# Patient Record
Sex: Female | Born: 1985 | Race: White | Hispanic: No | Marital: Single | Smoking: Never smoker
Health system: Southern US, Community
[De-identification: ages and names within clinical notes are randomized; demographics above are authoritative.]

---
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
## 2004-06-16 ENCOUNTER — Observation Stay (HOSPITAL_COMMUNITY): Admission: AD | Admit: 2004-06-16 | Discharge: 2004-06-17 | Payer: Self-pay | Admitting: Obstetrics and Gynecology

## 2004-07-04 ENCOUNTER — Inpatient Hospital Stay (HOSPITAL_COMMUNITY): Admission: AD | Admit: 2004-07-04 | Discharge: 2004-07-04 | Payer: Self-pay | Admitting: Obstetrics and Gynecology

## 2004-07-17 ENCOUNTER — Inpatient Hospital Stay (HOSPITAL_COMMUNITY): Admission: AD | Admit: 2004-07-17 | Discharge: 2004-07-17 | Payer: Self-pay | Admitting: Obstetrics and Gynecology

## 2004-07-19 ENCOUNTER — Inpatient Hospital Stay (HOSPITAL_COMMUNITY): Admission: AD | Admit: 2004-07-19 | Discharge: 2004-07-19 | Payer: Self-pay | Admitting: Obstetrics and Gynecology

## 2004-07-22 ENCOUNTER — Inpatient Hospital Stay (HOSPITAL_COMMUNITY): Admission: AD | Admit: 2004-07-22 | Discharge: 2004-07-25 | Payer: Self-pay | Admitting: Obstetrics and Gynecology

## 2004-08-01 ENCOUNTER — Inpatient Hospital Stay (HOSPITAL_COMMUNITY): Admission: AD | Admit: 2004-08-01 | Discharge: 2004-08-01 | Payer: Self-pay | Admitting: Obstetrics and Gynecology

## 2004-08-04 ENCOUNTER — Inpatient Hospital Stay (HOSPITAL_COMMUNITY): Admission: AD | Admit: 2004-08-04 | Discharge: 2004-08-07 | Payer: Self-pay | Admitting: Obstetrics and Gynecology

## 2004-08-04 ENCOUNTER — Encounter (INDEPENDENT_AMBULATORY_CARE_PROVIDER_SITE_OTHER): Payer: Self-pay | Admitting: *Deleted

## 2004-10-09 ENCOUNTER — Encounter (INDEPENDENT_AMBULATORY_CARE_PROVIDER_SITE_OTHER): Payer: Self-pay | Admitting: Specialist

## 2004-10-09 ENCOUNTER — Observation Stay (HOSPITAL_COMMUNITY): Admission: EM | Admit: 2004-10-09 | Discharge: 2004-10-09 | Payer: Self-pay | Admitting: Emergency Medicine

## 2005-03-23 ENCOUNTER — Other Ambulatory Visit: Admission: RE | Admit: 2005-03-23 | Discharge: 2005-03-23 | Payer: Self-pay | Admitting: Obstetrics and Gynecology

## 2006-04-02 ENCOUNTER — Ambulatory Visit: Payer: Self-pay | Admitting: *Deleted

## 2006-04-12 ENCOUNTER — Ambulatory Visit: Payer: Self-pay | Admitting: *Deleted

## 2006-04-19 ENCOUNTER — Ambulatory Visit: Payer: Self-pay | Admitting: *Deleted

## 2006-04-30 ENCOUNTER — Ambulatory Visit: Payer: Self-pay | Admitting: *Deleted

## 2006-05-07 ENCOUNTER — Ambulatory Visit: Payer: Self-pay | Admitting: *Deleted

## 2006-05-14 ENCOUNTER — Ambulatory Visit: Payer: Self-pay | Admitting: *Deleted

## 2006-05-21 ENCOUNTER — Ambulatory Visit: Payer: Self-pay | Admitting: *Deleted

## 2006-06-09 ENCOUNTER — Ambulatory Visit: Payer: Self-pay | Admitting: *Deleted

## 2006-06-16 ENCOUNTER — Ambulatory Visit: Payer: Self-pay | Admitting: *Deleted

## 2008-07-16 ENCOUNTER — Emergency Department (HOSPITAL_COMMUNITY): Admission: EM | Admit: 2008-07-16 | Discharge: 2008-07-16 | Payer: Self-pay | Admitting: Family Medicine

## 2008-08-24 ENCOUNTER — Emergency Department (HOSPITAL_COMMUNITY): Admission: EM | Admit: 2008-08-24 | Discharge: 2008-08-24 | Payer: Self-pay | Admitting: Family Medicine

## 2008-11-19 ENCOUNTER — Emergency Department (HOSPITAL_COMMUNITY): Admission: EM | Admit: 2008-11-19 | Discharge: 2008-11-20 | Payer: Self-pay | Admitting: Emergency Medicine

## 2009-10-07 IMAGING — CR DG CHEST 2V
2 series · 2 of 2 positions shown · non-contrast
Comparison: None available.

CLINICAL DATA: Back pain.

CHEST - 2 VIEW

[w chest pa]
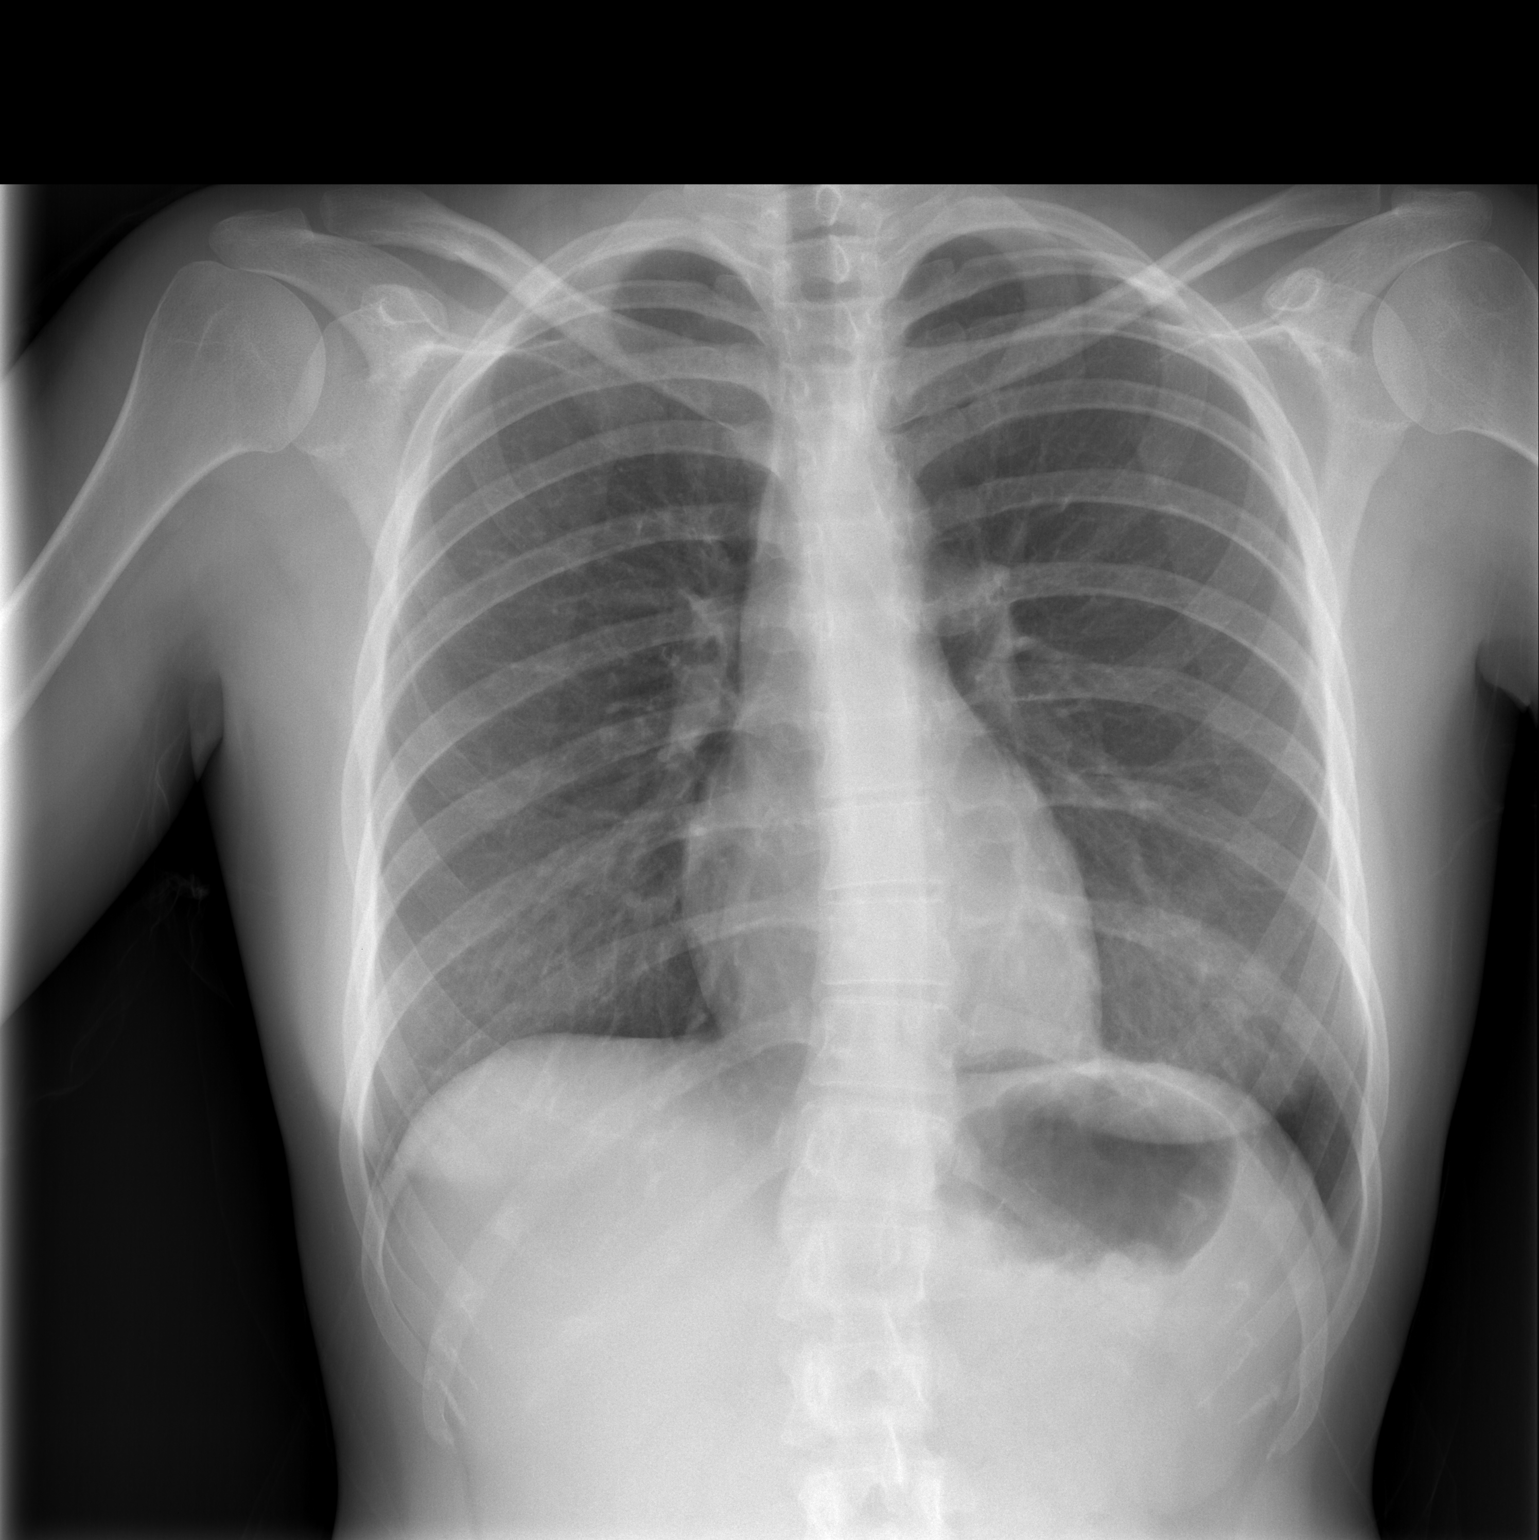

[w chest lat]
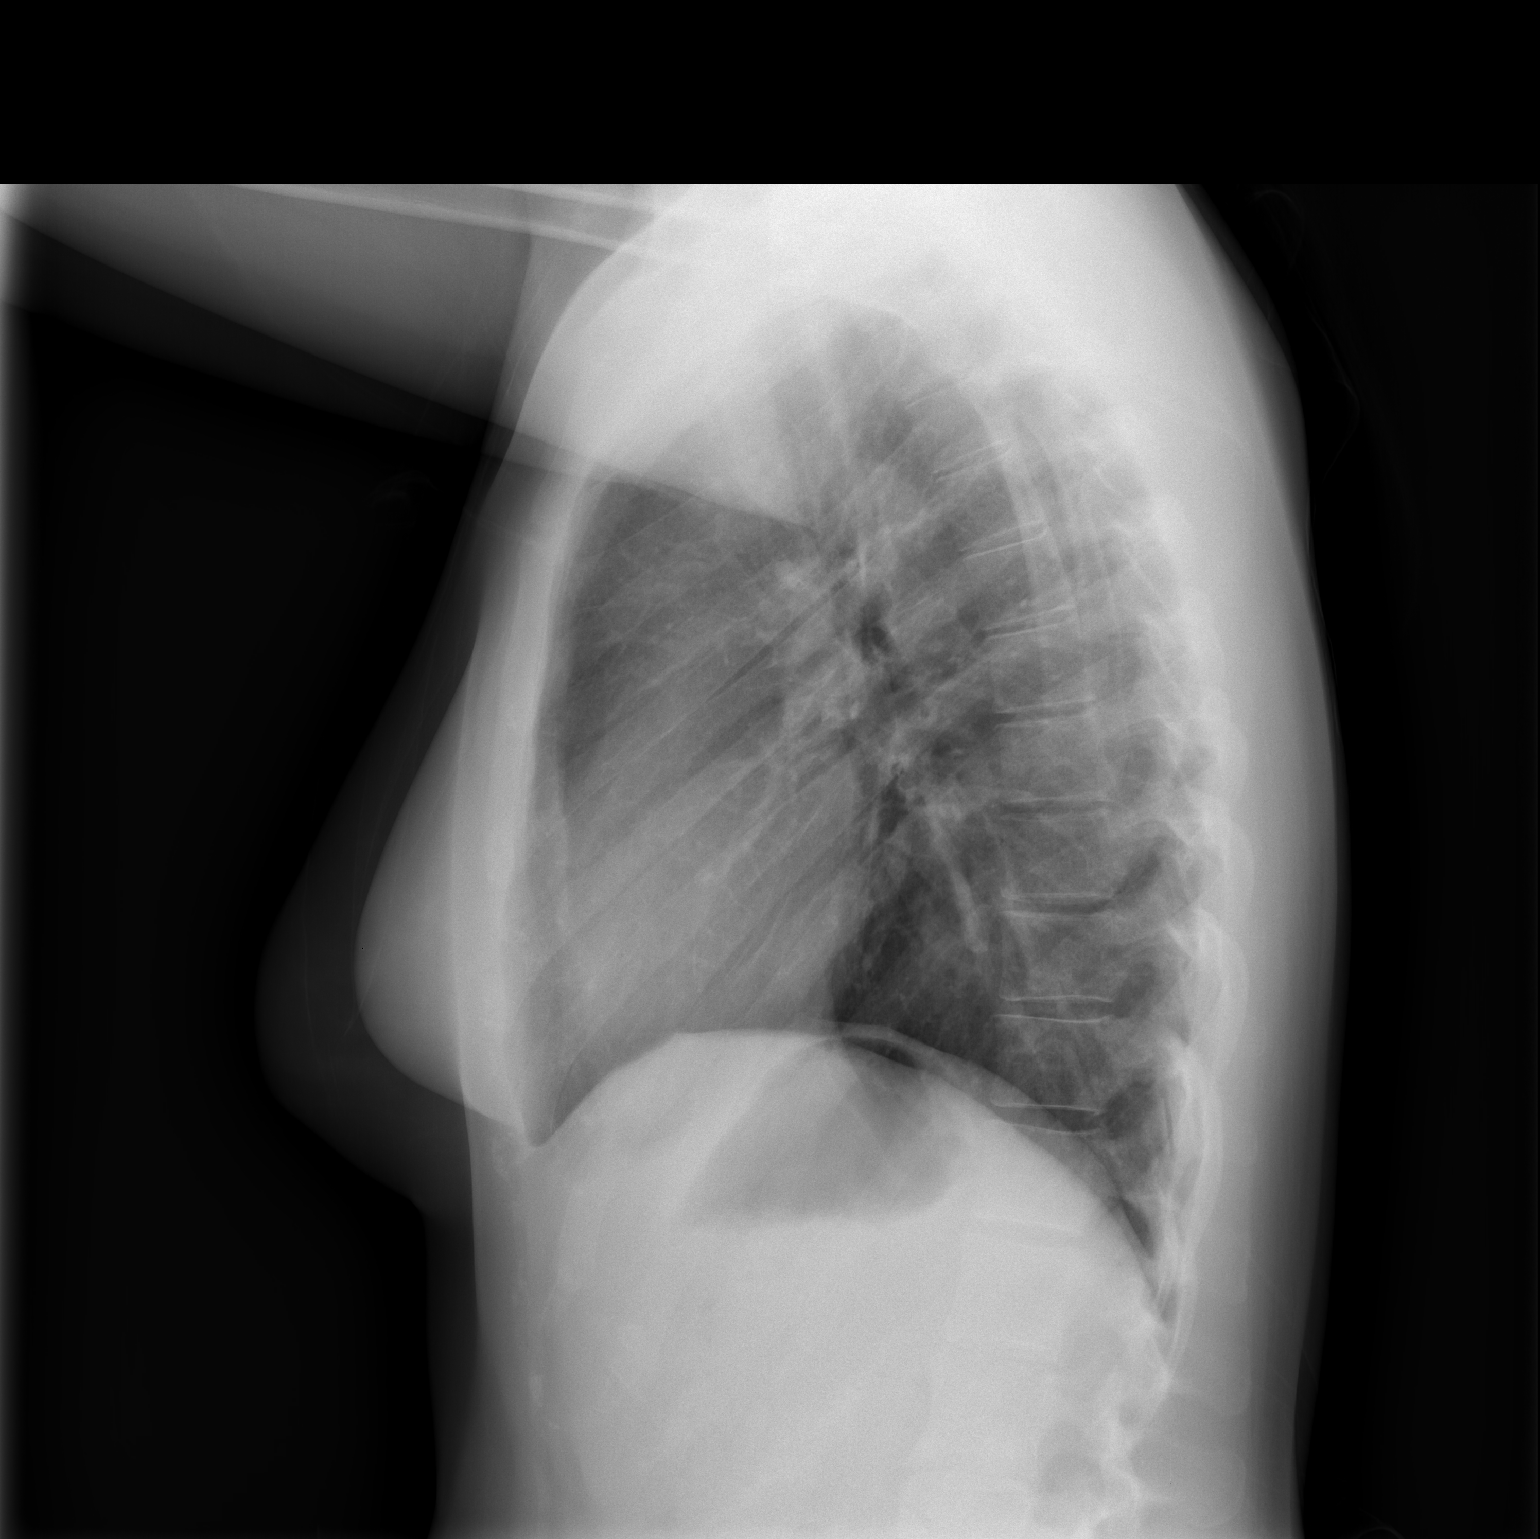

[2 of 2 positions shown; findings below may reference images not displayed]

FINDINGS: The lungs are clear.  Heart size is normal.  No pleural
effusion or focal bony abnormality.
IMPRESSION: No acute disease.

## 2011-04-17 NOTE — Discharge Summary (Signed)
NAME:  Anna Clark, Anna Clark                     ACCOUNT NO.:  [id number]   MEDICAL RECORD NO.:  [id number]                   PATIENT TYPE:  INP   LOCATION:  9165                                 FACILITY:  [facility]   PHYSICIAN:  Hal Morales, M.D.             DATE OF BIRTH:  January 28, 1986   DATE OF ADMISSION:  07/22/2004  DATE OF DISCHARGE:  07/25/2004                                 DISCHARGE SUMMARY   ADMISSION DIAGNOSES:  1. Intrauterine pregnancy at 36 and four-sevenths weeks.  2. Hypertension.  3. Questionable early labor.   DISCHARGE DIAGNOSES:  1. Intrauterine pregnancy at 37 and 0-sevenths weeks.  2. Hypertension, probable pregnancy-induced hypertension without evidence of     preeclampsia.   HOSPITAL PROCEDURES:  1. Electronic fetal monitoring.  2. Ultrasound.  3. A 24-hour urine.   HOSPITAL COURSE:  The patient was admitted with contractions and some  elevations in her blood pressure.  Cervix was 2 cm on admission and did not  change over the time of the admission.  Therefore, labor was ruled out.  She  had been on Procardia for blood pressure control.  She did complain of a  persistent headache but no visual symptoms.  She reported positive fetal  movement.  She was therefore admitted for blood pressure monitoring and  fetal heart rate and uterine contraction monitoring.  PIH labs were sent and  she received IV hydration.  Her hemoglobin came back at 13.3 with platelets  of 256.  PIH labs were all within normal with a uric acid of 5.8.  She  continued her 24-hour urine which came back as less than 4 mg/dl of protein  with a creatinine clearance of 103 which was normal.  Blood pressures  remained 120s to 130s over 74 to 94.  On hospital day #3 she was without  complaint.  She denied headache or visual changes.  She was afebrile.  Blood  pressures were 140s over 80s to 90s.  AFI per ultrasound was 17.5.  PIH labs  were normal.  Heart rate regular rate and rhythm, chest was  clear.  Fetal  heart rate was reactive with irregular contractions.  Extremities showed  trace edema.  She was deemed to have received the full benefit of her  hospital stay and was discharged home.   DISCHARGE MEDICATIONS:  1. Aldomet 500 mg p.o. b.i.d.  2. Prenatal vitamins.   DISCHARGE LABORATORY DATA:  White blood cell count 10.0, hemoglobin 11.3,  platelets 186.  SGOT 19, SGPT 13, uric acid 5.9.  Other chemistries within  normal limits.   DISCHARGE INSTRUCTIONS:  The patient to continue level 2 bedrest and  monitoring of fetal kick counts.  Discharge follow-up on July 29, 2004 for  NST, and further orders per Dr. Pennie Rushing.     Marie L. Williams, C.N.M.                 Hal Morales, M.D.  MLW/MEDQ  D:  07/25/2004  T:  07/26/2004  Job:  [id number]

## 2011-04-17 NOTE — Discharge Summary (Signed)
NAME:  Anna Clark, Anna Clark                     ACCOUNT NO.:  [id number]   MEDICAL RECORD NO.:  [id number]                   PATIENT TYPE:  INP   LOCATION:  9159                                 FACILITY:  [facility]   PHYSICIAN:  Osborn Coho, M.D.                DATE OF BIRTH:  [birth year]   DATE OF ADMISSION:  06/16/2004  DATE OF DISCHARGE:  06/17/2004                                 DISCHARGE SUMMARY   ADMISSION DIAGNOSES:  1. Intrauterine pregnancy at 31-3/7 weeks.  2. Mildly elevated blood pressures and a biophysical profile of 6 out of 8.   DISCHARGE DIAGNOSES:  1. Intrauterine pregnancy at 31-4/7 weeks.  2. Normalized blood pressure on Procardia.  3. Reassuring fetal heart rate tracing.   PROCEDURE:  None.   HOSPITAL COURSE:  The patient is a 25 year old gravida 2, para 0-0-1-0, at  31-3/7 weeks who was seen in the office on June 16, 2004, and noted to have  blood pressures of 120/90 and 130/100 for which she was sent to maternity  admission unit for further evaluation.  Her blood pressure stayed in the  130/70's to mid 90's and she had a biophysical profile that measured 6 out  of 8 with no breathing movements noted.  Her labs were all within normal  limits.  Her urine protein was negative and her physical examination was  also within normal limits.  She was admitted for 23-hour observation with  continuous fetal monitoring which has continued to be reassuring and she was  started on Procardia 10 mg t.i.d. for some preterm uterine contractions  noted during her stay in maternity admission unit.  Her uterine contractions  have subsequently resolved and her blood pressures have normalized and all  of her labs as previously mentioned are within normal limits.  She is in the  middle of a 24-hour urine currently.  She is deemed okay for discharge today  per Dr. Su Hilt.   DISCHARGE INSTRUCTIONS:  Continue on bed rest and to call for any signs or  symptoms of elevated blood pressure  or decreased fetal movement.   DISCHARGE MEDICATIONS:  1. Procardia 10 mg p.o. t.i.d.  2. Prenatal vitamins daily.   LABORATORY DATA:  Her SGOT is 20, SGPT is 19.  Creatinine is 0.5, WBC count  10.9, hemoglobin 12.7, platelets 274.  Urinalysis is negative.  Her uric  acid is 3.1, LDH is 130.   FOLLOW UP:  Tomorrow for blood pressure check and turn-in of her 24-hour  urine.  Friday, July 22, for a nonstress test.  Tuesday, July 26, for  another nonstress test.  Friday, July 29, for a nonstress test and visit  with M.D.   CONDITION ON DISCHARGE:  Well and stable.     Concha Pyo. Duplantis, C.N.M.              Osborn Coho, M.D.    SJD/MEDQ  D:  06/17/2004  T:  06/17/2004  Job:  [id number]

## 2011-04-17 NOTE — H&P (Signed)
NAME:  Anna Clark, Anna Clark NO.:  [id number]   MEDICAL RECORD NO.:  [id number]          PATIENT TYPE:  INP   LOCATION:  0102                         FACILITY:  [facility]   PHYSICIAN:  Ollen Gross. Vernell Morgans, M.D. DATE OF BIRTH:  1986/01/31   DATE OF ADMISSION:  10/08/2004  DATE OF DISCHARGE:                                HISTORY & PHYSICAL   HISTORY OF PRESENT ILLNESS:  Anna Clark is a 25 year old white female who  presents to the emergency room tonight with a right lower quadrant abdominal  pain that started this morning.  The pain has been severe in nature and has  not let up.  She denies any real fevers or nausea or vomiting, no chest  pain, diarrhea, shortness of breath, dysuria.  She is about two months  postpartum from a C-section and otherwise uncomplicated pregnancy.   REVIEW OF SYSTEMS:  Unremarkable.   PAST MEDICAL HISTORY:  Recent pregnancy with pregnancy induced hypertension,  migraine headaches.   PAST SURGICAL HISTORY:  Cesarean section.   CURRENT MEDICATIONS:  None.   ALLERGIES:  No known drug allergies.   SOCIAL HISTORY:  She denies any tobacco products.   FAMILY HISTORY:  Noncontributory.   PHYSICAL EXAMINATION:  In general, she is a well-developed, well-nourished  white female lying in bed a little uncomfortable.  Her skin is warm and dry  with no jaundice.  Extraocular movements are intact. Pupils equal round and  reactive to light. Sclerae are nonicteric.  Lungs are clear bilaterally with  no accessory muscles.  Heart is regular rate and rhythm with normal pulses.  Abdomen is soft but very tender in the right lower quadrant with guarding.  No palpable masses or hepatosplenomegaly.  Extremities have no clubbing,  cyanosis or edema with good strength in the arms and legs.  Psychologically,  she is alert and oriented x3 with no evidence of anxiety and depression.   LABORATORY DATA:  Her urine pregnancy was negative.  White count was  elevated at  14,000.  Her liver functions were normal. On reviewing her CT  scan, she did appear to have an enlarged, inflamed appendix consistent with  acute appendicitis.   ASSESSMENT AND PLAN:  This is a 25 year old white female with acute  appendicitis.  I think she would benefit from having her appendix removed  tonight.  I have explained to her in detail and with her family the risks  and benefits of the operation as well as some of the technical aspects and  they understand and wish to proceed.  We will plan for possible laparoscopic  appendectomy versus open.  We will take her to the operating room tonight.      PST/MEDQ  D:  10/09/2004  T:  10/09/2004  Job:  [id number]

## 2011-04-17 NOTE — H&P (Signed)
NAME:  Anna Clark, ADOLPH                     ACCOUNT NO.:  [id number]   MEDICAL RECORD NO.:  [id number]                   PATIENT TYPE:  INP   LOCATION:  9159                                 FACILITY:  [facility]   PHYSICIAN:  Janine Limbo, M.D.            DATE OF BIRTH:  01-15-1986   DATE OF ADMISSION:  06/16/2004  DATE OF DISCHARGE:                                HISTORY & PHYSICAL   HISTORY OF PRESENT ILLNESS:  Anna Clark is a 25 year old gravida 2, para 0-  0-1-0 at 31-3/7 weeks, who presented from the office for [facility] evaluation.  She had blood pressures of 120/90 and 130/100 in the office.  She denied  headache, visual symptoms and epigastric pain or swelling.  Baseline blood  pressures were 120/80 during her prenatal course.  Pregnancy has been  remarkable for:  1) Questionable LMP; 2) history of migraines; 3) history of  HPV.   While in Maternity Admissions, the patient had reactive fetal heart rate  tracing but did have some variables.  Her blood pressures were in the 131-  138/79-94 range.  She had an ultrasound that showed estimated fetal weight  greater than the 90% to 95% percentile, normal fluid but a BPP of 6/8 with  breathing movements not sustained.  Her PIH labs were within normal limits.  Her urine was negative for protein.  In light of her elevation of blood  pressure and BPP of 6/8, she was admitted to the hospital for 23-hour  observation and continuous fetal heart rate monitoring.   PRENATAL LABORATORIES:  Blood type is O-positive, Rh antibody negative.  VDRL nonreactive.  Rubella titer positive.  Hepatitis B surface antigen  negative.  HIV was declined.  Cystic fibrosis testing was negative.  GC and  Chlamydia cultures were negative.  Pap was normal.  Glucose challenge was  normal.  Hemoglobin upon entry into practice was 13.6; it was normal on  today's reevaluation.  EDC of August 15, 2004 was established by 11-week  ultrasound secondary to questionable  LMP.   HISTORY OF PRESENT PREGNANCY:  The patient entered care at approximately 14  weeks.  She had questionable LMP and had an ultrasound that actually dated  her at approximately 11 weeks.  She had HPV lesions noted on the right  labium near the fourchette.  She had an early ultrasound for dating purposes  which gave an [facility] of August 15, 2004.  She had another ultrasound at 19  weeks that showed normal growth and development and cervix was 3-cm long.  The rest of her pregnancy has been essentially uncomplicated.  Her blood  pressures have been in the 120s/80s.  Today was a regular visit for her.   OBSTETRICAL HISTORY:  In August of 2004, she had a first trimester  termination of pregnancy without complication.   MEDICAL HISTORY:  She was on an oral contraceptive patch and conceived close  to the time of coming off  the patch.  She also had been previously on  Yasmin.  She reports the usual childhood illnesses.  She has had more than 2  UTIs, occasional migraines.  She also reported a questionable history of a  heart murmur as a child but had no workup and no symptoms.   ALLERGIES:  She had no known medication allergies.   FAMILY HISTORY:  Her maternal grandmother had an MI and is now decreased.  Her mother is hypertensive, on medications.  Paternal grandmother had  emphysema and is now deceased.  Mother has adult-onset diabetes and also a  new diagnosis of high blood pressure.   GENETIC HISTORY:  Genetic history is unremarkable.   SOCIAL HISTORY:  The patient is single.  Father of the baby is not currently  involved.  Patient has 2 years of college; she is currently employed in  [employer] and is also a Dispensing optician taking 1 course.  Father of the baby has  an 11th-grade education.  She has been followed by the physicians' service  at [facility].  She denies any alcohol, drug or tobacco use during  this pregnancy.   PHYSICAL EXAM:  VITAL SIGNS:  Blood pressures are  131-138/79-94; other vital  signs are stable.  HEENT:  Within normal limits.  LUNGS:  Bilateral breath sounds are clear.  HEART:  Regular rate and rhythm without murmur.  BREASTS:  Breasts are soft and nontender.  ABDOMEN:  Fundal height is approximately 33 cm.  Estimated fetal weight is 4  pounds by ultrasound.  Fetal heart rate shows reactive fetal heart rate  tracing with occasional mild variables. There is uterine irritability noted  with some occasional contractions of which patient is not aware.  PELVIC:  Cervix is closed and long, vertex -2.  EXTREMITIES:  Deep tendon reflexes are 2+ without clonus.  There is no edema  noted.   IMAGING STUDIES:  Ultrasound shows single intrauterine pregnancy, vertex,  cervix 3.7 cm, estimated fetal weight 2067 through 2124 g, which is greater  than the 90th to 95th percentile.  Fluid is 13.5 cm.  BPP was 6/8 with  breathing movements not sustained.   LABORATORIES:  CBC:  Hemoglobin was 12.7, hematocrit of 36.8, white blood  cell count of 10.9 and platelets of 274,000.  Clean-catch urine showed  negative protein.  Comprehensive metabolic panel was normal.  Uric acid 3.1  and LDH of 130.   IMPRESSION:  1. Intrauterine pregnancy at 31-3/7 weeks.  2. Mild elevation of blood pressure.  3. Biophysical profile of 6/8.   PLAN:  1. Admit to birthing suite per consult with Dr. Janine Limbo for 23-     hour observation, continuous electronic fetal monitoring, initiation of     24-hour urine and Aldomet 250 mg t.i.d.  2. If fetal heart rate remains reactive, anticipate discharge, June 17, 2004.     Renaldo Reel Emilee Hero, C.N.M.                   Janine Limbo, M.D.    VLL/MEDQ  D:  06/16/2004  T:  06/17/2004  Job:  [id number]

## 2011-04-17 NOTE — H&P (Signed)
NAME:  Anna Clark, Anna Clark                     ACCOUNT NO.:  [id number]   MEDICAL RECORD NO.:  [id number]                   PATIENT TYPE:  INP   LOCATION:  9165                                 FACILITY:  [facility]   PHYSICIAN:  Osborn Coho, M.D.                DATE OF BIRTH:  [birth year]   DATE OF ADMISSION:  07/22/2004  DATE OF DISCHARGE:                                HISTORY & PHYSICAL   Anna Clark is a 25 year old gravida 2, para 0-0-1-0 at 36-4/7 weeks who  presented from the office for labor reevaluation and PIH evaluation.  She  was seen in the office today with irregular contractions and cervix 2 cm,  70% vertex, -2.  Her blood pressures in the office was 156/110 and 130/100.  She has been on Procardia XL 60 mg daily for the last several weeks due to  elevated blood pressure.  The patient reports persistent headache with no  visual symptoms.  She has had nausea and vomiting today but no epigastric  pain at present.  She reports positive fetal movement.  Pregnancy has been  remarkable for:   1. Elevated blood pressure since approximately 31 weeks with a 24-hour urine     done on 8/15 with value of 24-hour protein of 102.  She had normal PIH     labs then.  2. Questionable last menstrual period.  3. History of migraines.  4. History of HPV.  5. Positive group B strep.   The patient was evaluated in maternity admission unit and was noted to have  a lot of irritability on the Toco monitor.  Fetal heart rate was nonreactive  for a period of time but then did have a period of reactivity with one  segment of a negative spontaneous CST although subsequent to this her  contractions subsided somewhat.  Her blood pressures were mildly elevated.  The decision was made to admit for her 23-hour observation and to begin a 24-  hour urine.   PRENATAL LABORATORY DATA:  Blood type is O positive, Rh antibody negative,  VDRL nonreactive, rubella titer positive, hepatitis B surface antigen  negative.  HIV was declined.  Cystic fibrosis testing was negative.  GC and  chlamydia cultures were negative.  Pap was normal.  Glucose challenge was  normal.  Repeat RPR was negative.  Group B strep culture was positive at 36  weeks.  She had  PIH last done on 8/15 that were normal.  EDC of 08/15/04 was  established by 11 week ultrasound secondary to questionable LMP.   HISTORY OF PRESENT PREGNANCY:  The patient entered care at approximately 12-  13 weeks.  She had an ultrasound soon after her first visit that  reestablished her due date of 08/15/04.  She had another ultrasound at 19  weeks that was in agreement.  Quadruple screen was normal.  She had follow-  up ultrasound at 22 weeks secondary to inadequate anatomy  views.  This did  fill in the blanks of those things that were not able to be seen the first  time.  Her hemoglobin at 29 weeks was 12.2.  She was evaluated at 31 weeks  for elevated blood pressures of 120/90 and 130/100.  She had PIH labs at  that time which were normal.  She was admitted for 23-hour observation  secondary to mild elevations of her blood pressure and a BPP of 6/8.  She  had a 24-hour urine done at that time.  Estimated fetal weight at that time  was greater than the 96th percentile.  A 24-hour urine showed minimal  protein.  She had 4 mg of protein.  She was placed on Procardia at  approximately 32-33 weeks.  She had a BPP of 8/8 at 33 weeks with normal  fluid.  Cervix was 2.7 cm.  There was a nonreactive NST; however, the BPP  was normal.  Bilateral renal pelvises were noted to be in the upper limits  of normal.  NSTs were begun after that.  She had another 24 hour urine done  at approximately 34 weeks that showed minimal protein.  She was sent to [facility]U  again at 34 weeks because of elevated blood pressure and had PIH labs with  fetal monitoring and blood pressure evaluation.  BPP was 8/8 at that time.  She had significant constipation at 34 weeks.  She had  another 24-hour urine  done at that time which showed 102 mg of protein.  Pressures were in the  140s/88, 140/98 time and she had another ultrasound at 35 weeks that showed  growth in the 75th to 90th percentile and a BPP of 8/8.  Positive beta strep  was noted at that time and her cervix was 1 cm, 50%, -3.  She then presented  to the office today for evaluation of nausea, vomiting, and contractions.   OBSTETRICAL HISTORY:  In 8/04, she had a therapeutic termination of  pregnancy in the first trimester.   MEDICAL HISTORY:  She was on the patch from 8/04 until 11/04.  She was on  Yasmin previously.  She has had one yeast infection in the past.  She  reports the usual childhood illnesses.  She has had one or two UTIs in the  past.  She has occasional migraines.  She had a report of a heart murmur as  a child but has had no workup and has had no symptoms.   FAMILY HISTORY:  Her maternal grandmother had a heart attack.  Her mother is  hypertensive, on medication.  Her paternal grandmother had emphysema and is  now deceased.  Her mother has adult-onset diabetes and it is diet  controlled.   GENETIC HISTORY:  Unremarkable.   SOCIAL HISTORY:  The patient is not married.  The father of the baby is not  involved.  The patient is supported by her family.  The patient has 1 year  of college.  She is currently in school as well and is employed in [employer].  She is a Consulting civil engineer at [employer].  She is Caucasian in ethnicity.  She denies any  alcohol, drug, or tobacco use during this pregnancy.  She also denies any  history of abuse.   PHYSICAL EXAMINATION:  VITAL SIGNS:  Blood pressures were ranging from  139/104, 138/85,  144/80.  Other vital signs were stable.  HEENT:  Within normal limits.  HEART:  Regular rate and rhythm without murmur.  BREASTS:  Soft and nontender.  ABDOMEN:  Fundal height is approximately 36 cm.  Estimated fetal weight 6 to 6-1/2 pounds.  Uterine contractions are demonstrated to be  a very irritable  pattern.  There are occasional more defined uterine contractions.  Electronic fetal monitoring initially was nonreactive with no decelerations.  She then did have a segment of reactivity and had a single segment of a  negative, spontaneous CST.  EXTREMITIES:  Deep tendon reflexes are 1+ without clonus.  There is a trace  edema noted.  PELVIC:  Exam remains unchanged at 2 cm, 60% vertex, to -2 station.   IMPRESSION:  1. Intrauterine pregnancy at 36-4/7 weeks.  2. Mild elevation of her blood pressure.  3. Early versus prodromal labor.  4. Nausea and vomiting today.   PLAN:  1. Admit to the [facility] for 23-hour observation, for     consult with Dr. Osborn Coho as attending physician.  2. PIH labs will be evaluated.  3. A 24-hour urine will be done.  4. Labor observation for advancement of cervical dilatation.  5. Continuous electronic fetal monitoring.     Renaldo Reel Emilee Hero, C.N.M.                   Osborn Coho, M.D.    VLL/MEDQ  D:  07/23/2004  T:  07/23/2004  Job:  [id number]

## 2011-04-17 NOTE — Discharge Summary (Signed)
NAME:  Anna Clark, Anna Clark                     ACCOUNT NO.:  [id number]   MEDICAL RECORD NO.:  [id number]                   PATIENT TYPE:  INP   LOCATION:  9145                                 FACILITY:  [facility]   PHYSICIAN:  Osborn Coho, M.D.                DATE OF BIRTH:  06-05-1986   DATE OF ADMISSION:  08/04/2004  DATE OF DISCHARGE:                                 DISCHARGE SUMMARY   ADMISSION DIAGNOSES:  1.  Intrauterine pregnancy at term.  2.  Pregnancy-induced hypertension.  3.  Umbilical cord prolapse.   DISCHARGE DIAGNOSIS:  1.  Intrauterine pregnancy at term.  2.  Pregnancy-induced hypertension.  3.  Umbilical cord prolapse.  4.  Status post primary low transverse cesarean section of a female infant      named [name], Apgars 8 and 9.   HOSPITAL PROCEDURES:  1.  Induction of labor.  2.  Amniotomy.  3.  Primary low transverse cesarean section.  4.  General anesthesia.   HOSPITAL COURSE:  The patient was admitted for induction of labor secondary  to preeclampsia.  Cervix was 2-3 cm with the vertex at -1 to -2 on  admission.  Amniotomy was performed.  Immediately following that procedure  the umbilical cord was noted to have prolapsed in front of the head and an  emergency cesarean section was done without complications.  EBL was 700 mL.  Female infant, [name], was delivered with Apgars of 8 and 9.  On postoperative  day #1, the patient was tolerating clear liquids, doing well.  Hemoglobin  was 10.6 and routine care was given.  She received 24 hours of antibiotics  and was on Aldomet for her blood pressure.  On postoperative day #2, she was  doing well, beginning to diurese.  Blood pressures were 130s to 150s over  70s to 90s and she was continued with routine postoperative care.  On  postoperative day #3, vital signs were stable, blood pressure 129 to 150  over 79 to 95, heart rate regular rate and rhythm, lungs were clear.  Incision was clean, dry, and intact with  Steri-Strips.  Lochia was small,  extremities within normal limits.  She was deemed to have received the full  benefit of her hospital stay and was discharged home.  Her weight went from  150 to 145.   DISCHARGE LABORATORY DATA:  White blood cell count 11.5, hemoglobin 10.6,  platelets 195.  Chemistries were within normal limits.   DISCHARGE MEDICATIONS:  1.  Motrin 600 mg p.o. q.6h. p.r.n.  2.  Tylox one to two p.o. q.4h. p.r.n.   Decision was made to not have the patient on blood pressure medicines upon  discharge per Dr. Normand Sloop.   DISCHARGE FOLLOW-UP:  The patient is to be seen by the [facility] nurse in 1-  2 days for a blood pressure check.  If the blood pressure is elevated, she  will be seen  in the office in 1 week.  If the blood pressure is normal, she  will be seen in the office in 6 weeks.     Marie L. Williams, C.N.M.                 Osborn Coho, M.D.    MLW/MEDQ  D:  08/07/2004  T:  08/07/2004  Job:  [id number]

## 2011-04-17 NOTE — H&P (Signed)
NAME:  Anna Clark, Anna Clark                     ACCOUNT NO.:  [id number]   MEDICAL RECORD NO.:  [id number]                   PATIENT TYPE:  INP   LOCATION:  9145                                 FACILITY:  [facility]   PHYSICIAN:  Osborn Coho, M.D.                DATE OF BIRTH:  05/12/1986   DATE OF ADMISSION:  08/04/2004  DATE OF DISCHARGE:                                HISTORY & PHYSICAL   HISTORY OF PRESENT ILLNESS:  This is a 25 year old gravida 2, para 0-0-1-0,  at 8 3/7 weeks who presents for induction of labor secondary to pregnancy  induced hypertension which is becoming more difficult to manage.  She has  been followed by Dr. Su Hilt and her pregnancy has been remarkable for:  1.  Unsure LMP.  2.  History of migraines.  3.  HPV.  4.  Hypertension.  5.  Group B Strep positive.   OB HISTORY:  OB history is remarkable for a first trimester elective  abortion in 2004 with no complications.   PAST MEDICAL HISTORY:  Remarkable for yeast infections, childhood Varicella,  occasional migraines, history of a heart murmur as a child for which she has  not had any workup or symptoms.   FAMILY HISTORY:  Remarkable for a grandmother with MI, mother with  hypertension, grandmother with emphysema, mother with diabetes.  Genetic  history is unremarkable.   SOCIAL HISTORY:  Mother is single, father of the baby is not currently  involved.  Her family is supportive.  She does not report a religious  affiliation.  She denies any alcohol or drug abuse, but did smoke cigarettes  up until the beginning of pregnancy.   PRENATAL LABS:  Hemoglobin 13.6, platelets 292, blood type O positive,  antibody screen negative, RPR nonreactive, Rubella immune, hepatitis  negative, HIV declined, Pap test normal, Chlamydia negative, cystic fibrosis  negative, Group B Strep positive.   HISTORY OF CURRENT PREGNANCY:  Patient had care at [redacted] weeks gestation, she  had an ultrasound to confirm her dates in the  first trimester, her second  trimester ultrasound was within normal limits.  She developed some  hypertension at 31 weeks which continued.  Ultrasound at that time showed  greater than 95th percentile growth.  She was placed on Procardia for blood  pressure control, 24 hour urine.  She had 4 mg protein.  She had another 24  hour urine at 34 weeks which showed 102 mg protein.  Blood pressure was  still controlled on Procardia but she was later changed to [place].   OBJECTIVE:  VITAL SIGNS:  Stable, blood pressure 130/90.  HEENT:  Within normal limits.  Thyroid normal, not enlarged.  CHEST:  Clear to auscultation.  HEART:  Regular rate and rhythm.  ABDOMEN:  Gravid, 38 cm, EFM shows reactive fetal heart rate with occasional  contractions.  Cervix was 2/70/-2, and vertex.  EXTREMITIES:  Within normal limits.   ASSESSMENT:  1.  Intrauterine pregnancy at 38 weeks.  2.  Pregnancy induced hypertension.  3.  Elective induction of labor.   PLAN:  1.  Admit to birthing suites per Dr. Su Hilt.  2.  Elective induction of labor and further to follow.     Marie L. Williams, C.N.M.                 Osborn Coho, M.D.    MLW/MEDQ  D:  08/04/2004  T:  08/04/2004  Job:  [id number]

## 2011-04-17 NOTE — Op Note (Signed)
NAME:  Anna Clark, Anna Clark NO.:  [id number]   MEDICAL RECORD NO.:  [id number]          PATIENT TYPE:  INP   LOCATION:  0445                         FACILITY:  [facility]   PHYSICIAN:  Ollen Gross. Vernell Morgans, M.D. DATE OF BIRTH:  1986-06-23   DATE OF PROCEDURE:  10/08/2004  DATE OF DISCHARGE:  10/09/2004                                 OPERATIVE REPORT   PREOPERATIVE DIAGNOSIS:  Appendicitis.   POSTOPERATIVE DIAGNOSIS:  Appendicitis.   PROCEDURE:  Laparoscopic appendectomy.   SURGEON:  Dr. Carolynne Edouard   ANESTHESIA:  General endotracheal.   DESCRIPTION OF PROCEDURE:  After informed consent was obtained, the patient  was brought to the operating room and placed in a supine position on the  operating room table.  After adequate induction of general anesthesia, the  patient's abdomen was prepped with Betadine and draped in the usual sterile  manner.  The area below the umbilicus was infiltrated with 0.25% Marcaine,  and a small incision was made with the 15 blade knife.  This incision was  carried down through the skin, through the subcutaneous tissue bluntly with  a Kelly clamp and [employer] retractors until the linea alba was identified.  The linea alba was incised with the 15 blade knife, and each side was  grasped with Kocher clamps and elevated anteriorly.  The preperitoneal space  was then probed bluntly with a hemostat until the peritoneum was opened and  access was gained to the abdominal cavity.  A 0 Vicryl pursestring stitch  was placed in the fascia surrounding the opening.  An Hasson cannula was  placed through the opening and anchored in place with the previously-placed  Vicryl pursestring stitch.  The abdomen was then insufflated with carbon  dioxide without difficulty.  The laparoscope was placed through the Hasson  cannula, and the right lower quadrant was inspected.  The patient was placed  in Trendelenburg position and rotated slightly with the right side up.  The  cecum was able to be identified, and the tip of an inflamed appendix was  also identified.  Next, the suprapubic region was infiltrated with 0.25%  Marcaine, and a small incision was made with the 15 blade knife, and a 12 mm  port was placed bluntly through this incision into the abdominal cavity  under direct vision.  The laparoscope was then moved to the suprapubic port.  A site for an upper abdominal 5 mm port was then chosen, and this area was  infiltrated with 0.25% Marcaine.  A small stab incision was made with the 15  blade knife, and a 5 mm port was placed bluntly through this incision into  the abdominal cavity under direct vision.  Using a Glassman grasper and  harmonic scalpel, the appendix was able to be identified and elevated.  The  mesoappendix was taken down sharply with the harmonic scalpel to the base of  the appendix at its junction with the cecum.  Once this junction was easily  identified and cleared of any debris, the appendix was held anteriorly with  the [facility] grasper, and a laparoscopic GIA stapler was placed through the  Hasson cannula across the base of the appendix, clamped, and fired, thereby  dividing it between staple lines.  Next, a laparoscopic bag was placed  through the Hasson cannula, and the appendix was placed within the bag, and  the bag was sealed.  The appendiceal stump was then inspected.  It appeared  to be hemostatic.  The area was irrigated was copious amounts of saline.  The bag with the appendix was then removed with the Hasson cannula through  the infraumbilical port without difficulty.  The fascial defect was closed  with the previously placed Vicryl pursestring stitch as well as with another  interrupted 0 Vicryl stitch.  The rest of the ports were removed under  direct vision and were found to be hemostatic.  Gas was allowed to escape.  The fascial defect of the suprapubic port was closed with an interrupted 0  Vicryl stitch, and the skin  incisions were  all closed with interrupted 4-0 Monocryl subcuticular stitches.  Benzoin and  Steri-Strips were applied.  The patient tolerated the procedure well.  At  the end of the case, all needle, sponge, and instrument counts were correct.  The patient was then awakened and taken to the recovery room in stable  condition.      PST/MEDQ  D:  10/14/2004  T:  10/14/2004  Job:  [id number]

## 2011-04-17 NOTE — Op Note (Signed)
NAME:  Anna Clark, Anna Clark                     ACCOUNT NO.:  [id number]   MEDICAL RECORD NO.:  [id number]                   PATIENT TYPE:  INP   LOCATION:  9167                                 FACILITY:  [facility]   PHYSICIAN:  Osborn Coho, M.D.                DATE OF BIRTH:  12-09-1985   DATE OF PROCEDURE:  08/04/2004  DATE OF DISCHARGE:                                 OPERATIVE REPORT   PREOPERATIVE DIAGNOSES:  1.  Term intrauterine pregnancy.  2.  Pregnancy induced hypertension.  3.  Prolapsed cord.   POSTOPERATIVE DIAGNOSE:  1.  Term intrauterine pregnancy.  2.  Pregnancy induced hypertension.  3.  Prolapsed cord.   PROCEDURE:  Primary low transverse cesarean section via Pfannenstiel skin  incision.   SURGEON:  Osborn Coho, M.D.   ASSISTANT:  Elby Showers. Williams, C.N.M.   ANESTHESIA:  General.   FLUIDS:  800 mL.   URINE OUTPUT:  250 mL.   ESTIMATED BLOOD LOSS:  700 mL.   COMPLICATIONS:  None.   FINDINGS:  Alive female infant, [name].  Apgars 8 at one minute and 9 at five  minutes.  Normal appearing bilateral ovaries and tubes for gases.  Arterial  7.19 and venous of 7.28.   DESCRIPTION OF PROCEDURE:  The patient was taken to the operating room under  emergent conditions after cord prolapse noted.  The patient was placed under  general anesthesia and Betadine poured over abdomen.  Dressing was placed  and Pfannenstiel skin incision was made and carried down to the underlying  layer of fascia with the knife bilaterally.  The fascia was extended  manually and the muscles separated in the midline.  The peritoneum was  entered bluntly.  The bladder blade was placed and scalpel used to create  the bladder flap and uterine incision which was then extended manually.  The  infant was delivered.  The cord was clamped and cut.  The infant was handed  to the waiting pediatricians.  A dose of gentamicin was administered as  well.  The patient had already been receiving  penicillin for group B strep  positive status.  The placenta was removed via fundal massage.  Cord gas and  cord blood were collected.  The uterus was cleared of all clots and debris.  The uterus was exteriorized and uterine incision repaired with 0 Vicryl in a  running locked fashion.  A second imbricating layer was performed.  The  uterus was returned to the intra-abdominal cavity.  Copious irrigation was  performed and Foley was then placed with approximately 250 mL of urine  returning.  The peritoneum was closed with 3-0 chromic in a running fashion.  The fascia was repaired with 0 Vicryl in a running fashion.  The  subcutaneous tissue was irrigated and made hemostatic with the Bovie.  The  skin was closed with 3-0 Monocryl using a subcuticular stitch.  The patient  tolerated the  procedure well and currently is being extubated by anesthesia.  The patient will then be returned to the recovery room.  Radiology was  called for x-rays secondary to being able to do count prior to starting  procedure.  We are currently awaiting radiology report.                                               Osborn Coho, M.D.    AR/MEDQ  D:  08/04/2004  T:  08/04/2004  Job:  [id number]

## 2011-08-24 ENCOUNTER — Other Ambulatory Visit: Payer: Self-pay | Admitting: Obstetrics & Gynecology

## 2011-09-04 LAB — URINALYSIS, ROUTINE W REFLEX MICROSCOPIC
Hgb urine dipstick: NEGATIVE
Leukocytes, UA: NEGATIVE
Specific Gravity, Urine: 1.03 (ref 1.005–1.030)
Urobilinogen, UA: 1 mg/dL (ref 0.0–1.0)
pH: 7.5 (ref 5.0–8.0)

## 2011-09-04 LAB — PREGNANCY, URINE: Preg Test, Ur: NEGATIVE

## 2013-07-28 ENCOUNTER — Other Ambulatory Visit: Payer: Self-pay

## 2014-08-16 ENCOUNTER — Encounter (HOSPITAL_COMMUNITY): Payer: Self-pay | Admitting: Emergency Medicine

## 2014-08-16 ENCOUNTER — Emergency Department (HOSPITAL_COMMUNITY)
Admission: EM | Admit: 2014-08-16 | Discharge: 2014-08-16 | Disposition: A | Discharge status: Home / Self Care | Payer: 59 | Attending: Emergency Medicine | Admitting: Emergency Medicine

## 2014-08-16 ENCOUNTER — Emergency Department (HOSPITAL_COMMUNITY): Payer: 59

## 2014-08-16 DIAGNOSIS — R079 Chest pain, unspecified: Secondary | ICD-10-CM | POA: Diagnosis present

## 2014-08-16 DIAGNOSIS — R0789 Other chest pain: Secondary | ICD-10-CM | POA: Diagnosis not present

## 2014-08-16 DIAGNOSIS — R0602 Shortness of breath: Secondary | ICD-10-CM | POA: Diagnosis not present

## 2014-08-16 LAB — BASIC METABOLIC PANEL
ANION GAP: 13 (ref 5–15)
BUN: 10 mg/dL (ref 6–23)
CALCIUM: 9.1 mg/dL (ref 8.4–10.5)
CO2: 25 mEq/L (ref 19–32)
Chloride: 104 mEq/L (ref 96–112)
Creatinine, Ser: 0.64 mg/dL (ref 0.50–1.10)
GLUCOSE: 100 mg/dL — AB (ref 70–99)
POTASSIUM: 3.7 meq/L (ref 3.7–5.3)
SODIUM: 142 meq/L (ref 137–147)

## 2014-08-16 LAB — CBC
HCT: 40.7 % (ref 36.0–46.0)
HEMOGLOBIN: 13.7 g/dL (ref 12.0–15.0)
MCH: 32 pg (ref 26.0–34.0)
MCHC: 33.7 g/dL (ref 30.0–36.0)
MCV: 95.1 fL (ref 78.0–100.0)
Platelets: 262 10*3/uL (ref 150–400)
RBC: 4.28 MIL/uL (ref 3.87–5.11)
RDW: 12.8 % (ref 11.5–15.5)
WBC: 6.6 10*3/uL (ref 4.0–10.5)

## 2014-08-16 LAB — I-STAT TROPONIN, ED: TROPONIN I, POC: 0 ng/mL (ref 0.00–0.08)

## 2014-08-16 LAB — PRO B NATRIURETIC PEPTIDE: PRO B NATRI PEPTIDE: 16.3 pg/mL (ref 0–125)

## 2014-08-16 MED ORDER — NAPROXEN 500 MG PO TABS: 500.0000 mg | ORAL_TABLET | Freq: Two times a day (BID) | ORAL | Status: AC

## 2014-08-16 NOTE — ED Provider Notes (Signed)
CSN: [id number]     Arrival date & time 08/16/14  1229 History   First MD Initiated Contact with Patient 08/16/14 1529     Chief Complaint  Patient presents with  . Chest Pain     (Consider location/radiation/quality/duration/timing/severity/associated sxs/prior Treatment) HPI Anna Clark is a 28 y.o. female who presents to emergency department complaining of chest pain. Patient has no medical problems. She states pain started after she woke up this morning. She reports pain is central, states "it is between the breasts" and states radiate to the back. Pain is pressure-like, but states once in a while gets shooting pain that is sharp and is 10 out of 10. She reports associated shortness of breath. She does not know what brings the pain on. She states she does not have increased pain with movement or with exertion. She states her chest is tender to palpation. Denies any unusual activity or injury. Denies any recent travel or surgeries. No pain or swelling in legs. No cough or URI symptoms. No other complaints. Sates was at work and went to a medical office where they gave her maalox and sent her here. Pt denies hx of acid reflux  History reviewed. No pertinent past medical history. History reviewed. No pertinent past surgical history. No family history on file. History  Substance Use Topics  . Smoking status: Never Smoker   . Smokeless tobacco: Not on file  . Alcohol Use: No   OB History   Grav Para Term Preterm Abortions TAB SAB Ect Mult Living                 Review of Systems  Constitutional: Negative for fever and chills.  Respiratory: Positive for chest tightness and shortness of breath. Negative for cough, wheezing and stridor.   Cardiovascular: Positive for chest pain. Negative for palpitations and leg swelling.  Gastrointestinal: Negative for nausea, vomiting, abdominal pain and diarrhea.  Genitourinary: Negative for dysuria and flank pain.  Musculoskeletal: Negative  for arthralgias, myalgias, neck pain and neck stiffness.  Skin: Negative for rash.  Neurological: Negative for dizziness, weakness and headaches.  All other systems reviewed and are negative.     Allergies  Review of patient's allergies indicates no known allergies.  Home Medications   Prior to Admission medications   Not on File   BP 118/71  Pulse 54  Temp(Src) 97.3 F (36.3 C) (Oral)  Resp 18  SpO2 99%  LMP 08/16/2014 Physical Exam  Nursing note and vitals reviewed. Constitutional: She is oriented to person, place, and time. She appears well-developed and well-nourished. No distress.  HENT:  Head: Normocephalic.  Eyes: Conjunctivae are normal.  Neck: Neck supple.  Cardiovascular: Normal rate, regular rhythm and normal heart sounds.   Pulmonary/Chest: Effort normal and breath sounds normal. No respiratory distress. She has no wheezes. She has no rales. She exhibits tenderness.  Tenderness over lower sternum  Abdominal: Soft. Bowel sounds are normal. She exhibits no distension. There is no tenderness. There is no rebound.  Musculoskeletal: She exhibits no edema.  Neurological: She is alert and oriented to person, place, and time.  Skin: Skin is warm and dry.  Psychiatric: She has a normal mood and affect. Her behavior is normal.    ED Course  Procedures (including critical care time) Labs Review Labs Reviewed  BASIC METABOLIC PANEL - Abnormal; Notable for the following:    Glucose, Bld 100 (*)    All other components within normal limits  CBC  PRO B  NATRIURETIC PEPTIDE  I-STAT TROPOININ, ED    Imaging Review Dg Chest 2 View  08/16/2014   CLINICAL DATA:  Chest pain and shortness of Breath  EXAM: CHEST  2 VIEW  COMPARISON:  11/19/2008  FINDINGS: The heart size and mediastinal contours are within normal limits. Both lungs are clear. The visualized skeletal structures are unremarkable.  IMPRESSION: No active cardiopulmonary disease.   Electronically Signed   By: Alcide Clever M.D.   On: 08/16/2014 14:00     EKG Interpretation None      MDM   Final diagnoses:  Chest pain, unspecified chest pain type    Pt with no risk factors for CAD. Chest pain atypical. TIMI 0, heart score 1. Pt is PERC negative. suspect most likely costochondritis given severe tenderness over the sternum. Will treat with NSAIDs. Close follow up with primary care doctor.   Filed Vitals:   08/16/14 1239 08/16/14 1537  BP: 115/66 118/71  Pulse: 76 54  Temp: 97.3 F (36.3 C)   TempSrc: Oral   Resp: 18 18  SpO2: 100% 99%      Khaidyn Staebell A Laqueena Hinchey, PA-C 08/17/14 0021

## 2014-08-16 NOTE — ED Notes (Signed)
Pt reports constant central chest pressure with intermittent 10/10 sharp "shooting" back to the back. Pt also reports mild SOB, nausea, dizziness and lightheadedness. Pt in NAD. AO x4.

## 2014-08-16 NOTE — Discharge Instructions (Signed)
No strenuous activity. Naprosyn as prescribed. Follow up with primary care doctor if not improving. Return if worsening.    Chest Pain (Nonspecific) It is often hard to give a specific diagnosis for the cause of chest pain. There is always a chance that your pain could be related to something serious, such as a heart attack or a blood clot in the lungs. You need to follow up with your health care provider for further evaluation. CAUSES   Heartburn.  Pneumonia or bronchitis.  Anxiety or stress.  Inflammation around your heart (pericarditis) or lung (pleuritis or pleurisy).  A blood clot in the lung.  A collapsed lung (pneumothorax). It can develop suddenly on its own (spontaneous pneumothorax) or from trauma to the chest.  Shingles infection (herpes zoster virus). The chest wall is composed of bones, muscles, and cartilage. Any of these can be the source of the pain.  The bones can be bruised by injury.  The muscles or cartilage can be strained by coughing or overwork.  The cartilage can be affected by inflammation and become sore (costochondritis). DIAGNOSIS  Lab tests or other studies may be needed to find the cause of your pain. Your health care provider may have you take a test called an ambulatory electrocardiogram (ECG). An ECG records your heartbeat patterns over a 24-hour period. You may also have other tests, such as:  Transthoracic echocardiogram (TTE). During echocardiography, sound waves are used to evaluate how blood flows through your heart.  Transesophageal echocardiogram (TEE).  Cardiac monitoring. This allows your health care provider to monitor your heart rate and rhythm in real time.  Holter monitor. This is a portable device that records your heartbeat and can help diagnose heart arrhythmias. It allows your health care provider to track your heart activity for several days, if needed.  Stress tests by exercise or by giving medicine that makes the heart beat  faster. TREATMENT   Treatment depends on what may be causing your chest pain. Treatment may include:  Acid blockers for heartburn.  Anti-inflammatory medicine.  Pain medicine for inflammatory conditions.  Antibiotics if an infection is present.  You may be advised to change lifestyle habits. This includes stopping smoking and avoiding alcohol, caffeine, and chocolate.  You may be advised to keep your head raised (elevated) when sleeping. This reduces the chance of acid going backward from your stomach into your esophagus. Most of the time, nonspecific chest pain will improve within 2-3 days with rest and mild pain medicine.  HOME CARE INSTRUCTIONS   If antibiotics were prescribed, take them as directed. Finish them even if you start to feel better.  For the next few days, avoid physical activities that bring on chest pain. Continue physical activities as directed.  Do not use any tobacco products, including cigarettes, chewing tobacco, or electronic cigarettes.  Avoid drinking alcohol.  Only take medicine as directed by your health care provider.  Follow your health care provider's suggestions for further testing if your chest pain does not go away.  Keep any follow-up appointments you made. If you do not go to an appointment, you could develop lasting (chronic) problems with pain. If there is any problem keeping an appointment, call to reschedule. SEEK MEDICAL CARE IF:   Your chest pain does not go away, even after treatment.  You have a rash with blisters on your chest.  You have a fever. SEEK IMMEDIATE MEDICAL CARE IF:   You have increased chest pain or pain that spreads to your  arm, neck, jaw, back, or abdomen.  You have shortness of breath.  You have an increasing cough, or you cough up blood.  You have severe back or abdominal pain.  You feel nauseous or vomit.  You have severe weakness.  You faint.  You have chills. This is an emergency. Do not wait to  see if the pain will go away. Get medical help at once. Call your local emergency services (911 in [place] Do not drive yourself to the hospital. MAKE SURE YOU:   Understand these instructions.  Will watch your condition.  Will get help right away if you are not doing well or get worse. Document Released: 08/26/2005 Document Revised: 11/21/2013 Document Reviewed: 06/21/2008 [employer] Patient Information 2015 [place], [place]. This information is not intended to replace advice given to you by your health care provider. Make sure you discuss any questions you have with your health care provider.  Costochondritis Costochondritis, sometimes called Tietze syndrome, is a swelling and irritation (inflammation) of the tissue (cartilage) that connects your ribs with your breastbone (sternum). It causes pain in the chest and rib area. Costochondritis usually goes away on its own over time. It can take up to 6 weeks or longer to get better, especially if you are unable to limit your activities. CAUSES  Some cases of costochondritis have no known cause. Possible causes include:  Injury (trauma).  Exercise or activity such as lifting.  Severe coughing. SIGNS AND SYMPTOMS  Pain and tenderness in the chest and rib area.  Pain that gets worse when coughing or taking deep breaths.  Pain that gets worse with specific movements. DIAGNOSIS  Your health care provider will do a physical exam and ask about your symptoms. Chest X-rays or other tests may be done to rule out other problems. TREATMENT  Costochondritis usually goes away on its own over time. Your health care provider may prescribe medicine to help relieve pain. HOME CARE INSTRUCTIONS   Avoid exhausting physical activity. Try not to strain your ribs during normal activity. This would include any activities using chest, abdominal, and side muscles, especially if heavy weights are used.  Apply ice to the affected area for the first 2 days after  the pain begins.  Put ice in a plastic bag.  Place a towel between your skin and the bag.  Leave the ice on for 20 minutes, 2-3 times a day.  Only take over-the-counter or prescription medicines as directed by your health care provider. SEEK MEDICAL CARE IF:  You have redness or swelling at the rib joints. These are signs of infection.  Your pain does not go away despite rest or medicine. SEEK IMMEDIATE MEDICAL CARE IF:   Your pain increases or you are very uncomfortable.  You have shortness of breath or difficulty breathing.  You cough up blood.  You have worse chest pains, sweating, or vomiting.  You have a fever or persistent symptoms for more than 2-3 days.  You have a fever and your symptoms suddenly get worse. MAKE SURE YOU:   Understand these instructions.  Will watch your condition.  Will get help right away if you are not doing well or get worse. Document Released: 08/26/2005 Document Revised: 09/06/2013 Document Reviewed: 06/20/2013 [facility] Patient Information 2015 [place], [place]. This information is not intended to replace advice given to you by your health care provider. Make sure you discuss any questions you have with your health care provider.

## 2014-08-18 NOTE — ED Provider Notes (Signed)
Medical screening examination/treatment/procedure(s) were performed by non-physician practitioner and as supervising physician I was immediately available for consultation/collaboration.   EKG Interpretation   Date/Time:  Thursday August 16 2014 12:34:07 EDT Ventricular Rate:  71 PR Interval:  132 QRS Duration: 88 QT Interval:  390 QTC Calculation: 423 R Axis:   68 Text Interpretation:  Normal sinus rhythm Possible Left atrial enlargement  RSR' or QR pattern in V1 suggests right ventricular conduction delay  Nonspecific T wave abnormality Abnormal ECG ED PHYSICIAN INTERPRETATION  AVAILABLE IN [facility] Confirmed by TEST, Record ([id number]) on  08/18/2014 8:49:55 AM        Anna Maw Keshonda Monsour, DO 08/18/14 1712

## 2015-07-04 IMAGING — CR DG CHEST 2V
2 series · 2 of 2 positions shown · non-contrast
Comparison: 11/19/2008

CLINICAL DATA: Chest pain and shortness of Breath

EXAM:
CHEST  2 VIEW

[w chest pa]
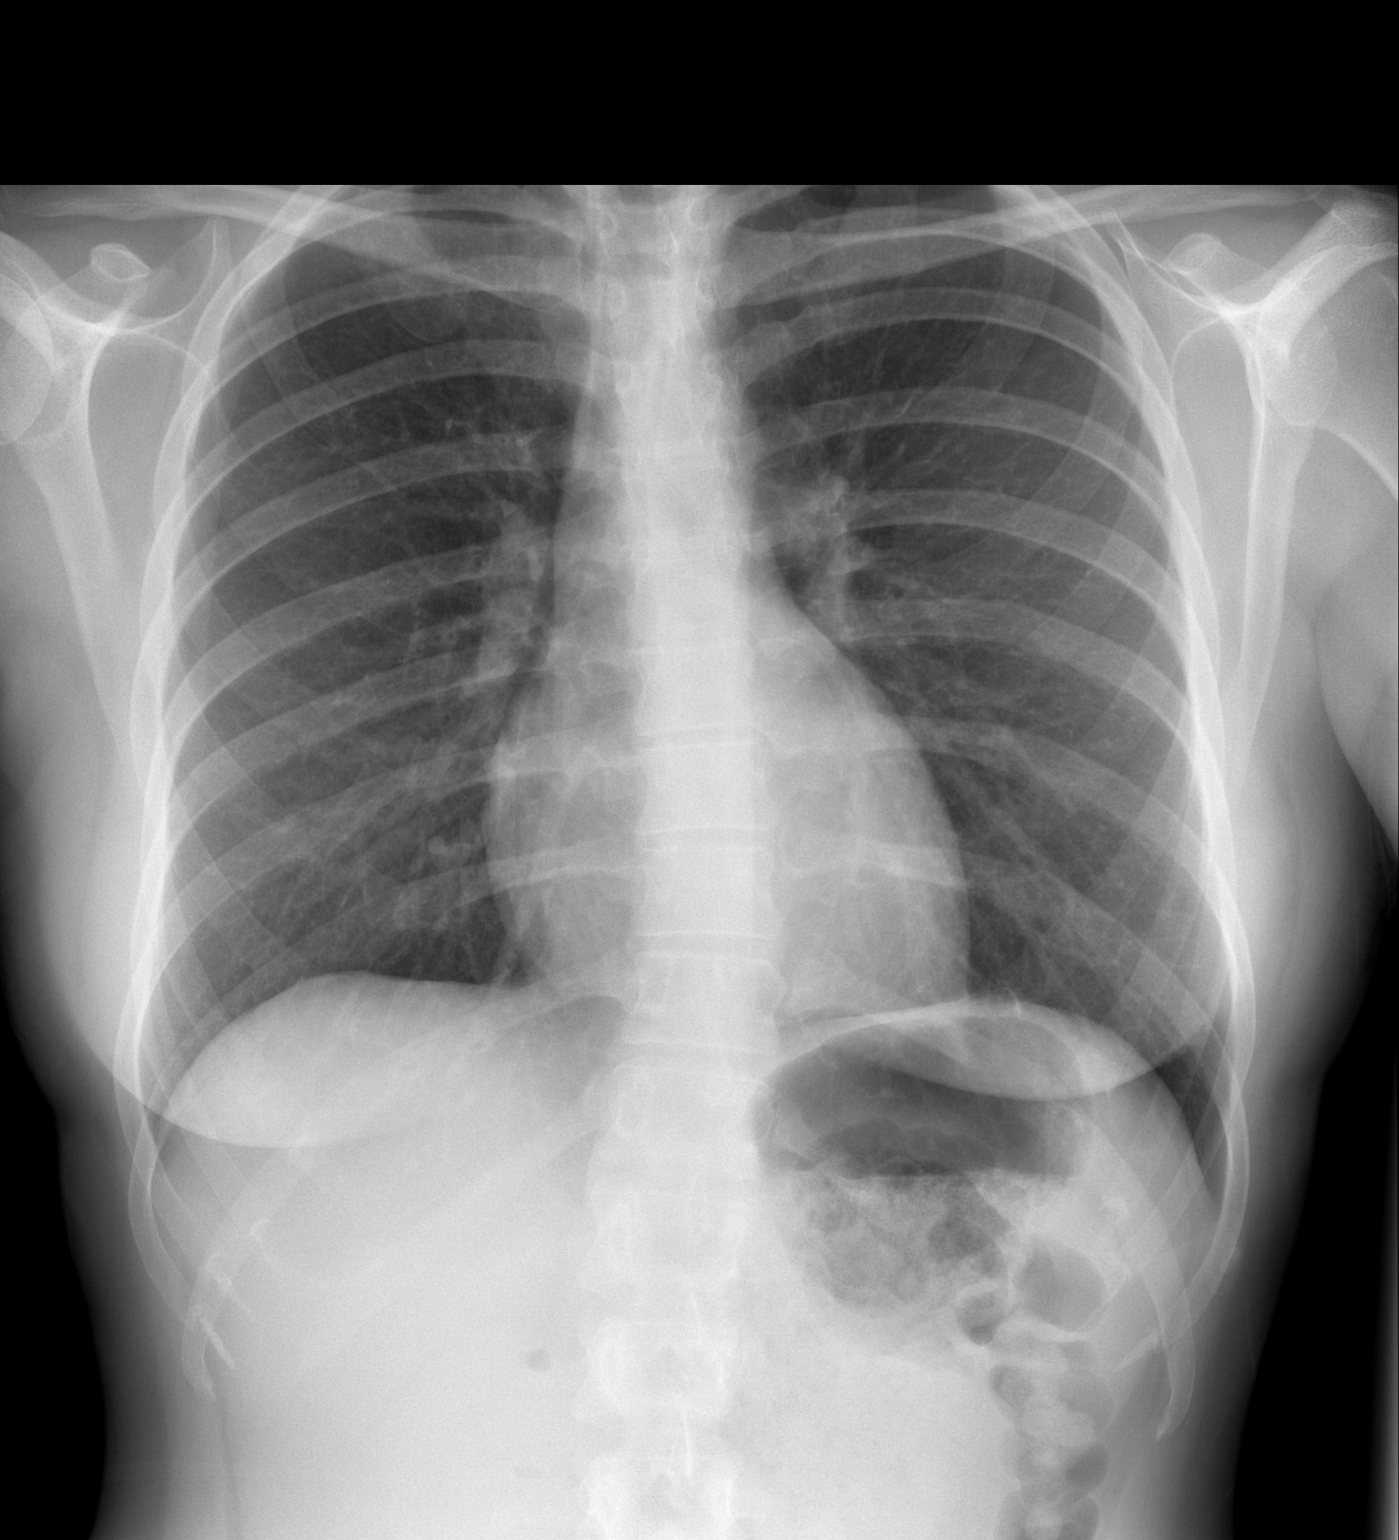

[w chest lat]
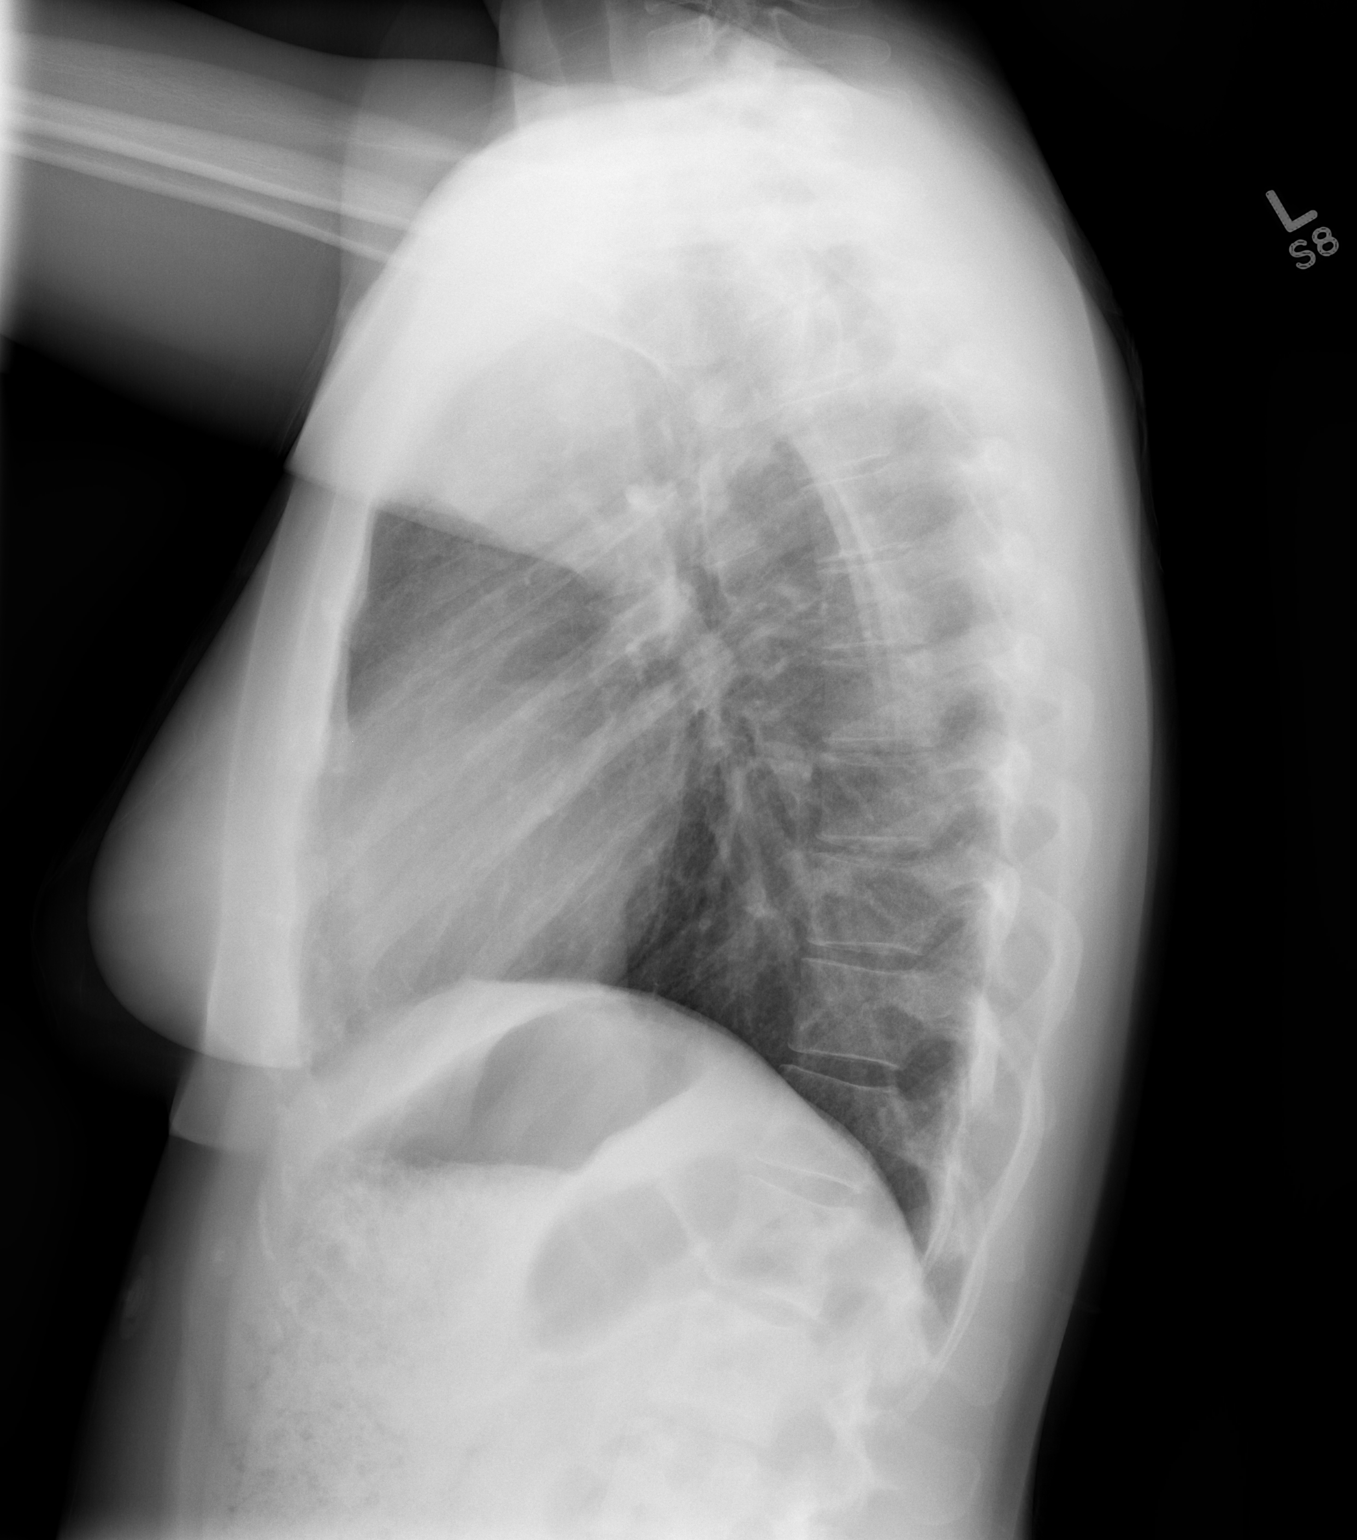

[2 of 2 positions shown; findings below may reference images not displayed]

FINDINGS: The heart size and mediastinal contours are within normal limits.
Both lungs are clear. The visualized skeletal structures are
unremarkable.
IMPRESSION: No active cardiopulmonary disease.

## 2015-08-29 ENCOUNTER — Other Ambulatory Visit: Payer: Self-pay

## 2015-09-03 LAB — CYTOLOGY - PAP

## 2016-09-30 ENCOUNTER — Other Ambulatory Visit: Payer: Self-pay | Admitting: Obstetrics & Gynecology

## 2019-11-27 ENCOUNTER — Ambulatory Visit (HOSPITAL_COMMUNITY)
Admission: RE | Admit: 2019-11-27 | Discharge: 2019-11-27 | Disposition: A | Discharge status: Home / Self Care | Payer: Medicaid Other | Attending: Psychiatry | Admitting: Psychiatry

## 2019-11-27 DIAGNOSIS — F338 Other recurrent depressive disorders: Secondary | ICD-10-CM | POA: Diagnosis not present

## 2019-11-27 DIAGNOSIS — F339 Major depressive disorder, recurrent, unspecified: Secondary | ICD-10-CM | POA: Diagnosis present

## 2019-11-27 DIAGNOSIS — F4310 Post-traumatic stress disorder, unspecified: Secondary | ICD-10-CM | POA: Diagnosis not present

## 2019-11-27 DIAGNOSIS — X58XXXA Exposure to other specified factors, initial encounter: Secondary | ICD-10-CM | POA: Diagnosis not present
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
